# Patient Record
Sex: Male | Born: 1970 | Race: White | Hispanic: No | Marital: Married | Smoking: Never smoker
Health system: Southern US, Community
[De-identification: ages and names within clinical notes are randomized; demographics above are authoritative.]

---

## 2003-09-07 ENCOUNTER — Encounter: Admission: RE | Admit: 2003-09-07 | Discharge: 2003-10-27 | Payer: Self-pay | Admitting: Neurosurgery

## 2009-08-25 ENCOUNTER — Encounter: Admission: RE | Admit: 2009-08-25 | Discharge: 2009-08-25 | Payer: Self-pay | Admitting: Unknown Physician Specialty

## 2010-04-10 ENCOUNTER — Encounter: Payer: Self-pay | Admitting: Family Medicine

## 2016-12-20 ENCOUNTER — Ambulatory Visit (INDEPENDENT_AMBULATORY_CARE_PROVIDER_SITE_OTHER): Payer: BLUE CROSS/BLUE SHIELD | Admitting: Cardiovascular Disease

## 2016-12-20 ENCOUNTER — Encounter: Payer: Self-pay | Admitting: Cardiovascular Disease

## 2016-12-20 VITALS — BP 126/96 | HR 79 | Ht 73.0 in | Wt 245.4 lb

## 2016-12-20 DIAGNOSIS — R0989 Other specified symptoms and signs involving the circulatory and respiratory systems: Secondary | ICD-10-CM | POA: Diagnosis not present

## 2016-12-20 DIAGNOSIS — R002 Palpitations: Secondary | ICD-10-CM | POA: Diagnosis not present

## 2016-12-20 DIAGNOSIS — F329 Major depressive disorder, single episode, unspecified: Secondary | ICD-10-CM | POA: Diagnosis not present

## 2016-12-20 DIAGNOSIS — F419 Anxiety disorder, unspecified: Secondary | ICD-10-CM | POA: Diagnosis not present

## 2016-12-20 DIAGNOSIS — Z13220 Encounter for screening for lipoid disorders: Secondary | ICD-10-CM | POA: Diagnosis not present

## 2016-12-20 DIAGNOSIS — F32A Depression, unspecified: Secondary | ICD-10-CM

## 2016-12-20 MED ORDER — METOPROLOL TARTRATE 25 MG PO TABS: 25.0000 mg | ORAL_TABLET | ORAL | 3 refills | Status: AC | PRN

## 2016-12-20 NOTE — Progress Notes (Signed)
Cardiology Office Note    Date:  12/20/2016   ID:  Cole Barrera, DOB Jul 28, 1970, MRN [id number]  PCP:  Medicine, [facility]  Cardiologist:  Nicki Guadalajara, MD   New cardiology consultation, referred through the request of [name], PA-C , for evaluation of palpitations.  History of Present Illness:  Cole Barrera is a 46 y.o. male who was seen yesterday by [name], PA-C  at [facility] family [facility] in [place] with complaints of palpitations.  The patient is now referred for cardiology consultation and further evaluation.  Mr. Cole Barrera admits to a history of intermittent palpitations.  These often would occur fairly infrequently, but have been occurring off and on some time but typically will be very short-lived.  However, 2 days ago, he noticed more frequent palpitations which lasted for almost 48 hours.  He felt like "butterflies."  He denied any associated chest tightness.  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  He has been under increased stress.  2 days ago work was more stressful.  He started noticed palpitations or frequently at home.  In the persisted leading to his evaluation yesterday.  Of note, during his evaluation yesterday on ECG.  He was found to have normal rhythm.  However, the patient states that he again started to feel the palpitations and when he was reexamined.  He was told that most likely he was experiencing premature beats rather than atrial fibrillation.  Yesterday when he presented for evaluation.  His blood pressure was 130/82 and his resting pulse was 94.  The patient states that he has a history of depression and had been taking Wellbutrin up to 3 mg but stopped taking this proximally 6 months ago.  Approximately 2 weeks ago he restarted taking Wellbutrin at 150 mg.  He does not drink coffee.  He drinks a 20  ounce Pepsi at least once a day.  He walks a lot at work and denies any exercise mediated palpitations.  He denies presyncope or syncope.  He is unaware of any cardiac murmurs.  He's never had an echo Doppler study.  Many years ago he believes he saw a cardiologist at [facility] but was not told of any significant abnormalities.  He is now referred for cardiology consultation.   No past medical history on file.  Past medical history is notable for depression.  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   Past surgical history is notable for ganglion cyst removed.  Current Medications: No outpatient prescriptions prior to visit.   No facility-administered medications prior to visit.     For the past 2 weeks.  He restarted taking Wellbutrin 150 mg daily.  He has a prescription for Valium which he takes when necessary.  Allergies:   Penicillins   Social History   Social History  . Marital status: Married    Spouse name: N/A  . Number of children: N/A  . Years of education: N/A   Social History Main Topics  . Smoking status: Never Smoker  . Smokeless tobacco: Never Used  . Alcohol use None  . Drug use: Unknown  . Sexual activity: Not Asked   Other Topics Concern  . None   Social History Narrative  .  None    Socially he is in his second marriage.  There is no tobacco use.  He is not routinely exercise outside of walking significant amount at work.  He has 2 children from a previous marriage and 3 children from his current marriage of 15 years.  There is no tobacco use.  He works at [employer] as a Production designer, theatre/television/film in Journalist, newspaper.  Family History:  The patient's family history includes Alcohol abuse in his mother; Heart disease in his maternal grandfather and maternal grandmother; Skin cancer in his brother.  His mother died with apparent suicide at age 38.   Father is living at age 96.  He has 2 brothers, one age 74 and 67 years old.  ROS General: Negative; No fevers, chills, or night sweats;  HEENT: Negative; No changes in vision or hearing, sinus congestion, difficulty swallowing Pulmonary: Negative; No cough, wheezing, shortness of breath, hemoptysis Cardiovascular: Negative; No chest pain, presyncope, syncope, palpitations GI: Negative; No nausea, vomiting, diarrhea, or abdominal pain GU: Negative; No dysuria, hematuria, or difficulty voiding Musculoskeletal: Positive for lumbar back pain Hematologic/Oncology: Negative; no easy bruising, bleeding Endocrine: Negative; no heat/cold intolerance; no diabetes Neuro: Negative; no changes in balance, headaches Skin: Negative; No rashes or skin lesions Psychiatric: Positive for anxiety and depression Sleep: Negative; No snoring, daytime sleepiness, hypersomnolence, bruxism, restless legs, hypnogognic hallucinations, no cataplexy Other comprehensive 14 point system review is negative.   PHYSICAL EXAM:   VS:  BP (!) 126/96   Pulse 79   Ht  (1.854 m)   Wt 245 lb 6.4 oz (111.3 kg)   BMI 32.38 kg/m     Repeat blood pressure by me was 110/84  Wt Readings from Last 3 Encounters:  12/20/16 245 lb 6.4 oz (111.3 kg)    General: Alert, oriented, no distress.  Skin: normal turgor, no rashes, warm and dry HEENT: Normocephalic, atraumatic. Pupils equal round and reactive to light; sclera anicteric; extraocular muscles intact; Fundi Without hemorrhages or exudates. Nose without nasal septal hypertrophy Mouth/Parynx benign; Mallinpatti scale 2 Neck: No JVD, no carotid bruits; normal carotid upstroke Lungs: clear to ausculatation and percussion; no wheezing or rales Chest wall: without tenderness to palpitation Heart: PMI not displaced, RRR, s1 s2 normal, faint 1/6 systolic murmur, no diastolic murmur, no rubs, gallops, thrills, or heaves Abdomen: soft, nontender; no hepatosplenomehaly, BS+;  abdominal aorta nontender and not dilated by palpation. Back: no CVA tenderness Pulses 2+ Musculoskeletal: full range of motion, normal strength, no joint deformities Extremities: Congenital almost complete absence of his left hand fingers; no clubbing cyanosis or edema, Homan's sign negative  Neurologic: grossly nonfocal; Cranial nerves grossly wnl Psychologic: Normal mood and affect   Studies/Labs Reviewed:   EKG:  EKG is ordered today.  ECG (independently read by me): Normal sinus rhythm at 79 bpm.  No ectopy.  No ST segment changes.  Normal intervals.  I also personally reviewed the ECG, which was done yesterday, 12/19/2016,  at his primary physician's office.  This showed normal sinus rhythm at 86 bpm.  No ST segment pain changes.  No ectopy.  Normal intervals.  Recent Labs: No flowsheet data found.   No flowsheet data found.  No flowsheet data found. No results found for: MCV No results found for: TSH No results found for: HGBA1C   BNP No results found for: BNP  ProBNP No results found for: PROBNP   Lipid Panel  No results found for: CHOL, TRIG, HDL, CHOLHDL, VLDL, LDLCALC, LDLDIRECT   RADIOLOGY:  No results found.   Additional studies/ records that were reviewed today include:  I reviewed the [facility] records from [facility].   ASSESSMENT:    1. Palpitations   2. Anxiety and depression   3. Labile blood pressure   4. Lipid screening      PLAN:  Mr. 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  He has noticed an intermittent history of episodic palpitations which typically would be very short-lived and resolve spontaneously.  However, over the past several days he's noticed significantly more frequent palpitations which persisted for almost 2 days leading to his primary care evaluation yesterday, and ultimate cardiology  consultation today.  He denies any history of exercise induced symptomatology.  He denies any chest tightness or pressure.  He denies presyncope or syncope.  His ECGs from yesterday and today are normal.  I suspect he may also have some stress mediated hypertension since he often checks his blood pressure at work and tells me on averages runs around 140/90.  He was told by his provider yesterday that is likely was having premature beats rather than atrial fibrillation.  He has not had recent laboratory.  I will check fasting laboratory today consisting of a chemistry profile, CBC, magnesium level, TSH, and also will also check fasting lipids.  I will schedule him for 2-D echo Doppler study to evaluate for potential structural heart disease.  I'm also scheduling him for 48 hour Holter monitor study to quantify if possible.  The extent infrequency of his palpitations.  Presently, I'm giving him a prescription for metoprolol, tartrate to take 25 mg on an as-needed basis.  If he does experience significant palpitations.  If his blood pressure continues to be elevated and he continues have frequent palpitations.  He may need to be started on metoprolol succinate on a daily basis, but I will not do this presently.  I have recommended absence of caffeine.  I suspect his palpitations are stress mediated leading to a hyperadrenergic response which should improve with low-dose beta blocker therapy if necessary.  I will contact him regarding the results of his laboratory.  He was wondering if he should take today's dose of well percussion.  I have suggested that he can resume this therapy.  QTc interval is normal.  I will see him back in the office and 4- 6 weeks for reevaluation and further recommendations will be made at that time.   Medication Adjustments/Labs and Tests Ordered: Current medicines are reviewed at length with the patient today.  Concerns regarding medicines are outlined above.  Medication changes, Labs  and Tests ordered today are listed in the Patient Instructions below. Patient Instructions  Medication Instructions:  Start metoprolol tartrate 25 mg AS NEEDED for palpitations-rx sent to pharmacy  Labwork: TODAY (CMET, CBC, TSH, Lipid, Mag)  Testing/Procedures: Your physician has recommended that you wear a 48 holter monitor. Holter monitors are medical devices that record the heart's electrical activity. Doctors most often use these monitors to diagnose arrhythmias. Arrhythmias are problems with the speed or rhythm of the heartbeat. The monitor is a small, portable device. You can wear one while you do your normal daily activities. This is usually used to diagnose what is causing palpitations/syncope (passing out).  Your physician has requested that you have an echocardiogram. Echocardiography is a painless test that uses sound waves to create images of your  heart. It provides your doctor with information about the size and shape of your heart and how well your heart's chambers and valves are working. This procedure takes approximately one hour. There are no restrictions for this procedure.  This will be done at our [facility] location:  [address]  Follow-Up: Your physician recommends that you schedule a follow-up appointment in: 4-6 weeks with Dr. Tresa Endo     If you need a refill on your cardiac medications before your next appointment, please call your pharmacy.      Signed, Nicki Guadalajara, MD  12/20/2016 12:56 PM    [facility] [address], [address], [place], [place]  [address] Phone: [phone]

## 2016-12-20 NOTE — Patient Instructions (Signed)
Medication Instructions:  Start metoprolol tartrate 25 mg AS NEEDED for palpitations-rx sent to pharmacy  Labwork: TODAY (CMET, CBC, TSH, Lipid, Mag)  Testing/Procedures: Your physician has recommended that you wear a 48 holter monitor. Holter monitors are medical devices that record the heart's electrical activity. Doctors most often use these monitors to diagnose arrhythmias. Arrhythmias are problems with the speed or rhythm of the heartbeat. The monitor is a small, portable device. You can wear one while you do your normal daily activities. This is usually used to diagnose what is causing palpitations/syncope (passing out).  Your physician has requested that you have an echocardiogram. Echocardiography is a painless test that uses sound waves to create images of your heart. It provides your doctor with information about the size and shape of your heart and how well your heart's chambers and valves are working. This procedure takes approximately one hour. There are no restrictions for this procedure.  This will be done at our [facility] location:  [address]  Follow-Up: Your physician recommends that you schedule a follow-up appointment in: 4-6 weeks with Dr. Tresa Endo     If you need a refill on your cardiac medications before your next appointment, please call your pharmacy.

## 2016-12-20 NOTE — Addendum Note (Signed)
Addended by: Chana Bode on: 12/20/2016 03:45 PM   Modules accepted: Orders

## 2016-12-21 LAB — COMPREHENSIVE METABOLIC PANEL
ALT: 23 IU/L (ref 0–44)
AST: 16 IU/L (ref 0–40)
Albumin/Globulin Ratio: 1.7 (ref 1.2–2.2)
Albumin: 4.7 g/dL (ref 3.5–5.5)
Alkaline Phosphatase: 82 IU/L (ref 39–117)
BUN/Creatinine Ratio: 10 (ref 9–20)
BUN: 13 mg/dL (ref 6–24)
Bilirubin Total: 1.1 mg/dL (ref 0.0–1.2)
CO2: 22 mmol/L (ref 20–29)
Calcium: 9.7 mg/dL (ref 8.7–10.2)
Chloride: 103 mmol/L (ref 96–106)
Creatinine, Ser: 1.25 mg/dL (ref 0.76–1.27)
GFR calc Af Amer: 78 mL/min/{1.73_m2} (ref >59)
GFR calc non Af Amer: 68 mL/min/{1.73_m2} (ref >59)
Globulin, Total: 2.8 g/dL (ref 1.5–4.5)
Glucose: 91 mg/dL (ref 65–99)
Potassium: 4.8 mmol/L (ref 3.5–5.2)
Sodium: 141 mmol/L (ref 134–144)
Total Protein: 7.5 g/dL (ref 6.0–8.5)

## 2016-12-21 LAB — CBC
Hematocrit: 46.9 % (ref 37.5–51.0)
Hemoglobin: 16.5 g/dL (ref 13.0–17.7)
MCH: 32.5 pg (ref 26.6–33.0)
MCHC: 35.2 g/dL (ref 31.5–35.7)
MCV: 92 fL (ref 79–97)
PLATELETS: 321 10*3/uL (ref 150–379)
RBC: 5.08 x10E6/uL (ref 4.14–5.80)
RDW: 13.4 % (ref 12.3–15.4)
WBC: 8.6 10*3/uL (ref 3.4–10.8)

## 2016-12-21 LAB — LIPID PANEL
CHOLESTEROL TOTAL: 167 mg/dL (ref 100–199)
Chol/HDL Ratio: 4.8 ratio (ref 0.0–5.0)
HDL: 35 mg/dL — ABNORMAL LOW (ref >39)
LDL Calculated: 70 mg/dL (ref 0–99)
Triglycerides: 310 mg/dL — ABNORMAL HIGH (ref 0–149)
VLDL CHOLESTEROL CAL: 62 mg/dL — AB (ref 5–40)

## 2016-12-21 LAB — TSH: TSH: 1.66 u[IU]/mL (ref 0.450–4.500)

## 2016-12-21 LAB — MAGNESIUM: MAGNESIUM: 2.2 mg/dL (ref 1.6–2.3)

## 2016-12-26 ENCOUNTER — Other Ambulatory Visit: Payer: Self-pay

## 2016-12-26 ENCOUNTER — Ambulatory Visit (HOSPITAL_COMMUNITY): Payer: BLUE CROSS/BLUE SHIELD | Attending: Internal Medicine

## 2016-12-26 DIAGNOSIS — R002 Palpitations: Secondary | ICD-10-CM | POA: Insufficient documentation

## 2016-12-28 ENCOUNTER — Encounter: Payer: Self-pay | Admitting: Cardiovascular Disease

## 2017-01-01 ENCOUNTER — Encounter: Payer: Self-pay | Admitting: Cardiovascular Disease

## 2017-01-02 ENCOUNTER — Other Ambulatory Visit: Payer: Self-pay | Admitting: *Deleted

## 2017-01-02 MED ORDER — OMEGA-3-ACID ETHYL ESTERS 1 G PO CAPS: 2.0000 g | ORAL_CAPSULE | Freq: Two times a day (BID) | ORAL | 3 refills | Status: DC

## 2017-01-03 ENCOUNTER — Ambulatory Visit (INDEPENDENT_AMBULATORY_CARE_PROVIDER_SITE_OTHER): Payer: BLUE CROSS/BLUE SHIELD

## 2017-01-03 DIAGNOSIS — R002 Palpitations: Secondary | ICD-10-CM

## 2017-01-08 ENCOUNTER — Other Ambulatory Visit: Payer: Self-pay | Admitting: *Deleted

## 2017-01-12 ENCOUNTER — Telehealth: Payer: Self-pay | Admitting: *Deleted

## 2017-01-12 NOTE — Telephone Encounter (Signed)
PA submitted via Covermymeds for Lovaza (omega -3-acid-ethyl esters)

## 2017-01-15 ENCOUNTER — Encounter: Payer: Self-pay | Admitting: Cardiovascular Disease

## 2017-02-01 ENCOUNTER — Ambulatory Visit (INDEPENDENT_AMBULATORY_CARE_PROVIDER_SITE_OTHER): Payer: BLUE CROSS/BLUE SHIELD | Admitting: Cardiovascular Disease

## 2017-02-01 ENCOUNTER — Encounter: Payer: Self-pay | Admitting: Cardiovascular Disease

## 2017-02-01 VITALS — BP 130/88 | HR 93 | Ht 73.0 in | Wt 242.0 lb

## 2017-02-01 DIAGNOSIS — R002 Palpitations: Secondary | ICD-10-CM

## 2017-02-01 DIAGNOSIS — E782 Mixed hyperlipidemia: Secondary | ICD-10-CM | POA: Diagnosis not present

## 2017-02-01 DIAGNOSIS — Z79899 Other long term (current) drug therapy: Secondary | ICD-10-CM | POA: Diagnosis not present

## 2017-02-01 DIAGNOSIS — R0789 Other chest pain: Secondary | ICD-10-CM | POA: Diagnosis not present

## 2017-02-01 NOTE — Progress Notes (Signed)
Cardiology Office Note    Date:  02/01/2017   ID:  Cole Barrera, DOB Jan 01, 1971, MRN [id number]  PCP:  [facility]  Cardiologist:  Shelva Majestic, MD   Cardiology consultation F/U, referred through the request of Clyde Lundborg, PA-C , for evaluation of palpitations.  History of Present Illness:  Cole Barrera is a 46 y.o. male who was seen yesterday by Clyde Lundborg, PA-C  at [facility] in [place] with complaints of palpitations.  The patient was seen for initial cardiology consultation on 12/20/2016.  He presents for follow-up evaluation. Mr. Cole Barrera admits to a history of intermittent palpitations.  These often would occur fairly infrequently, but have been occurring off and on some time but typically will be very short-lived.  However, 2 days ago, he noticed more frequent palpitations which lasted for almost 48 hours.  He felt like "butterflies."  He denied any associated chest tightness.  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  He has been under increased stress.  2 days ago work was more stressful.  He started noticed palpitations or frequently at home.  In the persisted leading to his evaluation yesterday.  Of note, during his evaluation yesterday on ECG.  He was found to have normal rhythm.  However, the patient states that he again started to feel the palpitations and when he was reexamined.  He was told that most likely he was experiencing premature beats rather than atrial fibrillation.  Yesterday when he presented for evaluation.  His blood pressure was 130/82 and his resting pulse was 94.  The patient states that he has a history of depression and had been taking Wellbutrin up to 3 mg but stopped taking this proximally 6 months ago.  Approximately 2 weeks ago he restarted taking Wellbutrin at 150 mg.  He does not  drink coffee.  He drinks a 20 ounce Pepsi at least once a day.  He walks a lot at work and denies any exercise mediated palpitations.  He denies presyncope or syncope.  He is unaware of any cardiac murmurs.  He's never had an echo Doppler study.  Many years ago he believes he saw a cardiologist at [facility] but was not told of any significant abnormalities.    When I initially saw him, I recommended that he undergo a 48-hour Holter monitor study as well as 2-D echo Doppler study.  Echo Doppler study done on 12/26/2016 revealed normal systolic function with an ejection fraction of 55-60%.  There was mild grade 1 diastolic dysfunction.  His left atrium was normal in size.  He did not have any significant valvular pathology.  Bony pressures were normal.  He wore a 48-hour Holter monitor which revealed predominant sinus rhythm with his slowest rate at 44 bpm the early morning while sleeping, and fastest heart rate demonstrating sinus tachycardia when he was stuck in prolonged traffic jam and was getting agitated.  He was noted to have only rare to occasional PAC with one atrial couplet and had rare PVCs with one ventricular couplet.  He has taken metoprolol 25 mg on a when necessary basis and several days and did note improvement in his sensation.  Upon further questioning today, he has noticed vague episode of chest discomfort which typically occurs when his heart rate is beating fast.  He denies PND, orthopnea.  He presents for reevaluation.  History reviewed. No pertinent past medical history.  Past medical history is notable for depression.  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 No pertinent surgical history.   Past surgical history is notable for ganglion cyst removed.  Current Medications: Outpatient Medications Prior to Visit  Medication Sig Dispense Refill  . diazepam (VALIUM) 5  MG tablet Take 1 tablet by mouth as directed.    . metoprolol tartrate (LOPRESSOR) 25 MG tablet Take 1 tablet (25 mg total) by mouth as needed. For palpitations 30 tablet 3  . Omega-3 Fatty Acids (FISH OIL PO) Take by mouth.    . omega-3 acid ethyl esters (LOVAZA) 1 g capsule Take 2 capsules (2 g total) by mouth 2 (two) times daily. 180 capsule 3  . buPROPion (WELLBUTRIN XL) 300 MG 24 hr tablet Take 1 tablet by mouth daily.     No facility-administered medications prior to visit.     For the past 2 weeks.  He restarted taking Wellbutrin 150 mg daily.  He has a prescription for Valium which he takes when necessary.  Allergies:   Penicillins   Social History   Socioeconomic History  . Marital status: Married    Spouse name: None  . Number of children: None  . Years of education: None  . Highest education level: None  Social Needs  . Financial resource strain: None  . Food insecurity - worry: None  . Food insecurity - inability: None  . Transportation needs - medical: None  . Transportation needs - non-medical: None  Occupational History  . None  Tobacco Use  . Smoking status: Never Smoker  . Smokeless tobacco: Never Used  Substance and Sexual Activity  . Alcohol use: None  . Drug use: None  . Sexual activity: None  Other Topics Concern  . None  Social History Narrative  . None    Socially he is in his second marriage.  There is no tobacco use.  He is not routinely exercise outside of walking significant amount at work.  He has 2 children from a previous marriage and 3 children from his current marriage of 59 years.  There is no tobacco use.  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  Family History:  The patient's family history includes Alcohol abuse in his mother; Heart disease in his maternal grandfather and maternal grandmother; Skin cancer in his brother.  His mother died with apparent suicide at age 60.  Father is living at age 62.  He has 2 brothers, one age  49 and 58 years old.  ROS General: Negative; No fevers, chills, or night sweats;  HEENT: Negative; No changes in vision or hearing, sinus congestion, difficulty swallowing Pulmonary: Negative; No cough, wheezing, shortness of breath, hemoptysis Cardiovascular: Negative; No chest pain, presyncope, syncope, palpitations GI: Negative; No nausea, vomiting, diarrhea, or abdominal pain GU: Negative; No dysuria, hematuria, or difficulty voiding Musculoskeletal: Positive for lumbar back pain Hematologic/Oncology: Negative; no easy bruising, bleeding Endocrine: Negative; no heat/cold intolerance; no diabetes Neuro: Negative; no changes in balance, headaches Skin: Negative; No rashes or skin lesions Psychiatric: Positive for anxiety and depression Sleep: Negative; No snoring, daytime sleepiness, hypersomnolence, bruxism, restless legs, hypnogognic hallucinations, no cataplexy Other comprehensive 14 point system review is negative.   PHYSICAL EXAM:   VS:  BP 130/88   Pulse 93   Ht '6\' 1"'$  (  1.854 m)   Wt 242 lb (109.8 kg)   BMI 31.93 kg/m     Repeat blood pressure by me was 122/84.  Wt Readings from Last 3 Encounters:  02/01/17 242 lb (109.8 kg)  12/20/16 245 lb 6.4 oz (111.3 kg)      Physical Exam BP 130/88   Pulse 93   Ht '6\' 1"'$  (1.854 m)   Wt 242 lb (109.8 kg)   BMI 31.93 kg/m  General: Alert, oriented, no distress.  Skin: normal turgor, no rashes, warm and dry HEENT: Normocephalic, atraumatic. Pupils equal round and reactive to light; sclera anicteric; extraocular muscles intact;  Nose without nasal septal hypertrophy Mouth/Parynx benign; Mallinpatti scale 2 Neck: No JVD, no carotid bruits; normal carotid upstroke Lungs: clear to ausculatation and percussion; no wheezing or rales Chest wall: without tenderness to palpitation Heart: PMI not displaced, RRR, s1 s2 normal, 1/6 systolic murmur, no diastolic murmur, no rubs, gallops, thrills, or heaves Abdomen: soft, nontender; no  hepatosplenomehaly, BS+; abdominal aorta nontender and not dilated by palpation. Back: no CVA tenderness Pulses 2+ Musculoskeletal: full range of motion, normal strength, no joint deformities Extremities: Almost complete absence of his left hand fingers congenital in etiology;;no clubbing cyanosis or edema, Homan's sign negative  Neurologic: grossly nonfocal; Cranial nerves grossly wnl Psychologic: Normal mood and affect   Studies/Labs Reviewed:    ECG (independently read by me): Normal sinus rhythm at 93 bpm.  No ectopy.  Rhythm strip was done by the nurse since patient felt he was experiencing some sensation of increased heart rate with palpitations, revealed normal sinus rhythm.  No ectopy.  Heart rate in the 90s.  ECG (independently read by me): Normal sinus rhythm at 79 bpm.  No ectopy.  No ST segment changes.  Normal intervals.  I also personally reviewed the ECG, which was done yesterday, 12/19/2016,  at his primary physician's office.  This showed normal sinus rhythm at 86 bpm.  No ST segment pain changes.  No ectopy.  Normal intervals.  Recent Labs: BMP Latest Ref Rng & Units 12/20/2016  Glucose 65 - 99 mg/dL 91  BUN 6 - 24 mg/dL 13  Creatinine 0.76 - 1.27 mg/dL 1.25  BUN/Creat Ratio 9 - 20 10  Sodium 134 - 144 mmol/L 141  Potassium 3.5 - 5.2 mmol/L 4.8  Chloride 96 - 106 mmol/L 103  CO2 20 - 29 mmol/L 22  Calcium 8.7 - 10.2 mg/dL 9.7     Hepatic Function Latest Ref Rng & Units 12/20/2016  Total Protein 6.0 - 8.5 g/dL 7.5  Albumin 3.5 - 5.5 g/dL 4.7  AST 0 - 40 IU/L 16  ALT 0 - 44 IU/L 23  Alk Phosphatase 39 - 117 IU/L 82  Total Bilirubin 0.0 - 1.2 mg/dL 1.1    CBC Latest Ref Rng & Units 12/20/2016  WBC 3.4 - 10.8 x10E3/uL 8.6  Hemoglobin 13.0 - 17.7 g/dL 16.5  Hematocrit 37.5 - 51.0 % 46.9  Platelets 150 - 379 x10E3/uL 321   Lab Results  Component Value Date   MCV 92 12/20/2016   Lab Results  Component Value Date   TSH 1.660 12/20/2016   No results found  for: HGBA1C   BNP No results found for: BNP  ProBNP No results found for: PROBNP   Lipid Panel     Component Value Date/Time   CHOL 167 12/20/2016 1232   TRIG 310 (H) 12/20/2016 1232   HDL 35 (L) 12/20/2016 1232   CHOLHDL 4.8 12/20/2016 1232  [place] 70 12/20/2016 1232     RADIOLOGY: No results found.   Additional studies/ records that were reviewed today include:  I reviewed the [facility] records from [facility].   ASSESSMENT:    1. Palpitations   2. Atypical chest pain   3. Medication management   4. Mixed hyperlipidemia      PLAN:  Mr. 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  He had noticed an intermittent history of episodic palpitations which typically would be very short-lived and resolve spontaneously.  However, over the past several days he's noticed significantly more frequent palpitations which persisted for almost 2 days leading to his primary care evaluation yesterday, and ultimate initial cardiology consultation.  Since I saw him on 12/20/2016, his 2-D echo Doppler study has shown normal systolic function without structural heart abnormalities.  There was very mild grade 1 diastolic dysfunction.  He had normal valvular architecture.  I reviewed his 48 hour Holter monitor study with him in detail.  This predominantly showed sinus rhythm with mild sinus arrhythmia.  His lowest heart rate was 44 bpm in the early morning while sleeping.  His fastest heartbeat was 144 bpm which was sinus tachycardia when he was getting very agitated and was stuck in a traffic jam.  There were rare isolated PVCs and one ventricular couplet, and occasional isolated PACs with one atrial couplet.  On days where he has felt this increased palpitation sensation.  He has tried the metoprolol 25 mg, which I had given him to take on a when necessary basis and  this has improved his symptomatology.  He also today admitted to experiencing any chest discomfort when his heart rate is beating fast.  He is uncertain if he has exertional precipitation to this discomfort.  For further evaluation.  I have recommended he undergo a routine graded exercise treadmill test, which will also be helpful to make certain he does not have any exercise-induced arrhythmia.  I reassured him that he did not have any significant structural heart disease.  I do not believe he requires daily, beta blocker therapy, but I recommended he continue with the when necessary metoprolol tartrate.  I suspect his palpitations are a hyperadrenergic response contributed by stress.  I again discussed importance of reduction of caffeine.  I reviewed recent laboratory.  Triglycerides were elevated and he is now on omega-3 fatty acids.  Apparently his insurance company denied the use of lovaza.  The his recent triglycerides were 310 with a VLDL of 62 and HDL low at 35, suggesting an atherogenic dyslipidemic panel and I suspect his LDL particle size is small.  In 4-5 months after significantly improved diet and increased exercise I have recommended a follow-up lipid panel.  I will see him in the office in follow-up evaluation.    Medication Adjustments/Labs and Tests Ordered: Current medicines are reviewed at length with the patient today.  Concerns regarding medicines are outlined above.  Medication changes, Labs and Tests ordered today are listed in the Patient Instructions below. Patient Instructions  Medication Instructions:  Your physician recommends that you continue on your current medications as directed. Please refer to the Current Medication list given to you today.  Labwork: Please return for FASTING labs in 4-5 months (Lipid)  Our in office lab hours are Monday-Friday 8:00-4:30, closed for lunch 1-2 pm.  No appointment needed.  Testing/Procedures: Your physician  has requested that you have  an exercise tolerance test. For further information please visit [email or web address]. Please also follow instruction sheet, as given.  Follow-Up: Your physician wants you to follow-up in: 6 months with Dr. Claiborne Billings.  You will receive a reminder letter in the mail two months in advance. If you don't receive a letter, please call our office to schedule the follow-up appointment.   Any Other Special Instructions Will Be Listed Below (If Applicable).     If you need a refill on your cardiac medications before your next appointment, please call your pharmacy.      Signed, Shelva Majestic, MD  02/01/2017 4:31 PM    [facility] [address] [place], [place], [place]  [address] Phone: [phone]

## 2017-02-01 NOTE — Patient Instructions (Signed)
Medication Instructions:  Your physician recommends that you continue on your current medications as directed. Please refer to the Current Medication list given to you today.  Labwork: Please return for FASTING labs in 4-5 months (Lipid)  Our in office lab hours are Monday-Friday 8:00-4:30, closed for lunch 1-2 pm.  No appointment needed.  Testing/Procedures: Your physician has requested that you have an exercise tolerance test. For further information please visit [email or web address]. Please also follow instruction sheet, as given.  Follow-Up: Your physician wants you to follow-up in: 6 months with Dr. Tresa EndoKelly.  You will receive a reminder letter in the mail two months in advance. If you don't receive a letter, please call our office to schedule the follow-up appointment.   Any Other Special Instructions Will Be Listed Below (If Applicable).     If you need a refill on your cardiac medications before your next appointment, please call your pharmacy.

## 2017-02-07 ENCOUNTER — Telehealth (HOSPITAL_COMMUNITY): Payer: Self-pay

## 2017-02-07 NOTE — Telephone Encounter (Signed)
Encounter complete. 

## 2017-02-14 ENCOUNTER — Inpatient Hospital Stay (HOSPITAL_COMMUNITY)
Admission: RE | Admit: 2017-02-14 | Payer: BLUE CROSS/BLUE SHIELD | Source: Ambulatory Visit | Attending: Cardiovascular Disease | Admitting: Cardiovascular Disease

## 2017-04-11 ENCOUNTER — Telehealth (HOSPITAL_COMMUNITY): Payer: Self-pay | Admitting: Cardiovascular Disease

## 2017-04-11 NOTE — Telephone Encounter (Signed)
03/09/17 Called pt and lmsg for him to CB to r/s ETT..RG 03/27/17 Called pt and lmsg for him to Cb to r/s ETT..RG 03/29/17 Called pt and lmsg for him to CB to r/s ETT..RG 04/11/17 Called pt and lmsg for him to CB to get r/s for ETT.Marland Kitchen.RG

## 2018-08-26 ENCOUNTER — Other Ambulatory Visit: Payer: Self-pay | Admitting: Neurology

## 2018-08-26 DIAGNOSIS — S060X1A Concussion with loss of consciousness of 30 minutes or less, initial encounter: Secondary | ICD-10-CM

## 2018-08-28 ENCOUNTER — Inpatient Hospital Stay: Admission: RE | Admit: 2018-08-28 | Payer: BLUE CROSS/BLUE SHIELD | Source: Ambulatory Visit

## 2018-08-28 ENCOUNTER — Other Ambulatory Visit: Payer: BC Managed Care – PPO

## 2018-08-28 ENCOUNTER — Ambulatory Visit
Admission: RE | Admit: 2018-08-28 | Discharge: 2018-08-28 | Disposition: A | Discharge status: Home / Self Care | Payer: BC Managed Care – PPO | Source: Ambulatory Visit | Attending: Neurology | Admitting: Neurology

## 2018-08-28 DIAGNOSIS — S060X1A Concussion with loss of consciousness of 30 minutes or less, initial encounter: Secondary | ICD-10-CM

## 2018-09-05 ENCOUNTER — Other Ambulatory Visit: Payer: Self-pay | Admitting: Neurology

## 2019-06-18 ENCOUNTER — Other Ambulatory Visit: Payer: Self-pay | Admitting: Sports Medicine

## 2019-06-18 DIAGNOSIS — M542 Cervicalgia: Secondary | ICD-10-CM

## 2019-06-18 DIAGNOSIS — M5412 Radiculopathy, cervical region: Secondary | ICD-10-CM

## 2019-06-23 ENCOUNTER — Other Ambulatory Visit: Payer: Self-pay

## 2019-06-23 ENCOUNTER — Ambulatory Visit
Admission: RE | Admit: 2019-06-23 | Discharge: 2019-06-23 | Disposition: A | Discharge status: Home / Self Care | Payer: BC Managed Care – PPO | Source: Ambulatory Visit | Attending: Sports Medicine | Admitting: Sports Medicine

## 2019-06-23 DIAGNOSIS — M5412 Radiculopathy, cervical region: Secondary | ICD-10-CM

## 2019-06-23 DIAGNOSIS — M542 Cervicalgia: Secondary | ICD-10-CM

## 2021-03-27 IMAGING — MR MR CERVICAL SPINE W/O CM
5 series · 37 of 48 positions shown · non-contrast
Comparison: Head CT 08/28/2018.

CLINICAL DATA: 48-year-old female with 6 months of pain and
numbness radiating to the right arm. No known injury.

EXAM:
MRI CERVICAL SPINE WITHOUT CONTRAST
TECHNIQUE: Multiplanar, multisequence MR imaging of the cervical spine was
performed. No intravenous contrast was administered.

[Series 5: T1 · sagittal · 3.0mm · 0.82mm/px · 6 of 15 slices shown]
[im 1/15]
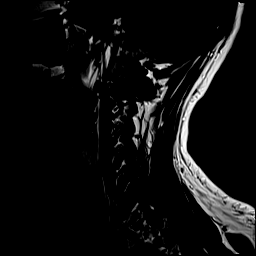
[im 3/15]
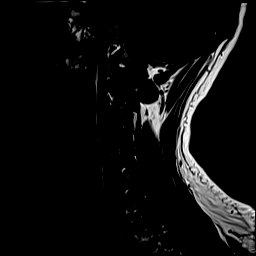
[im 6/15]
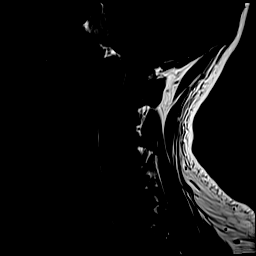
[im 9/15]
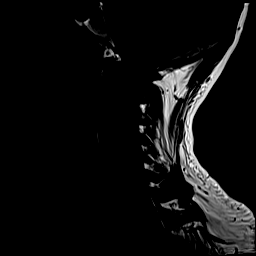
[im 12/15]
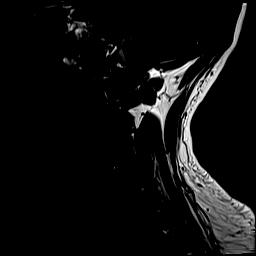
[im 15/15]
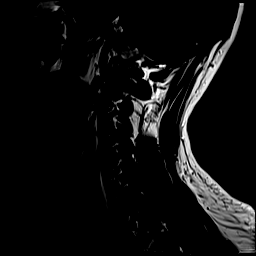

[Series 6: T2 · sagittal · 3.0mm · 0.55mm/px · 6 of 15 slices shown (1 of 2)]
[im 1/15]
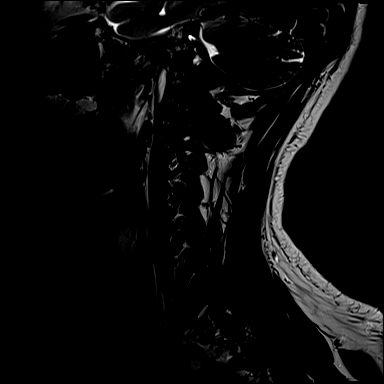
[im 3/15]
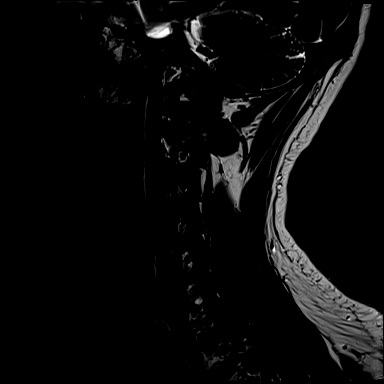
[im 6/15]
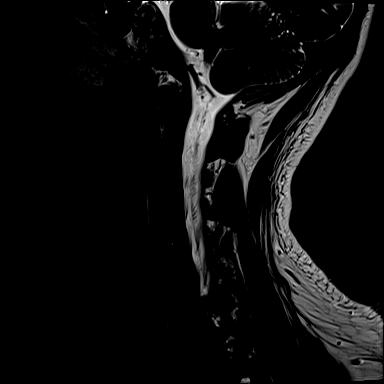
[im 9/15]
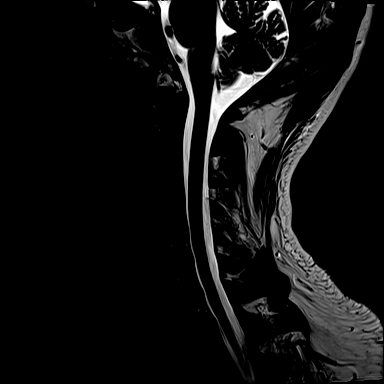
[im 12/15]
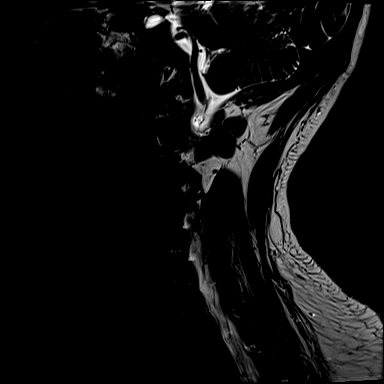
[im 15/15]
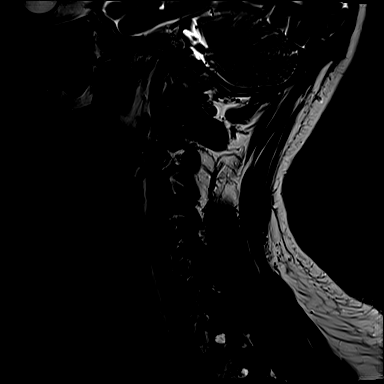

[Series 7: STIR · sagittal · 3.0mm · 0.33mm/px · 6 of 15 slices shown]
[im 1/15]
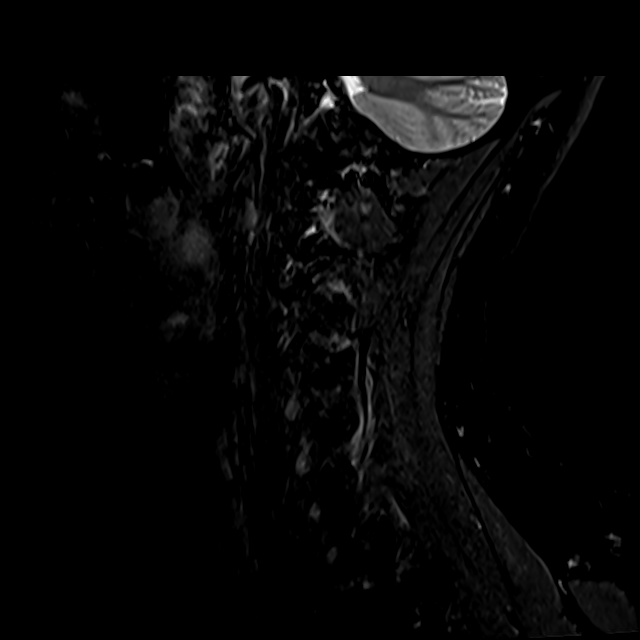
[im 3/15]
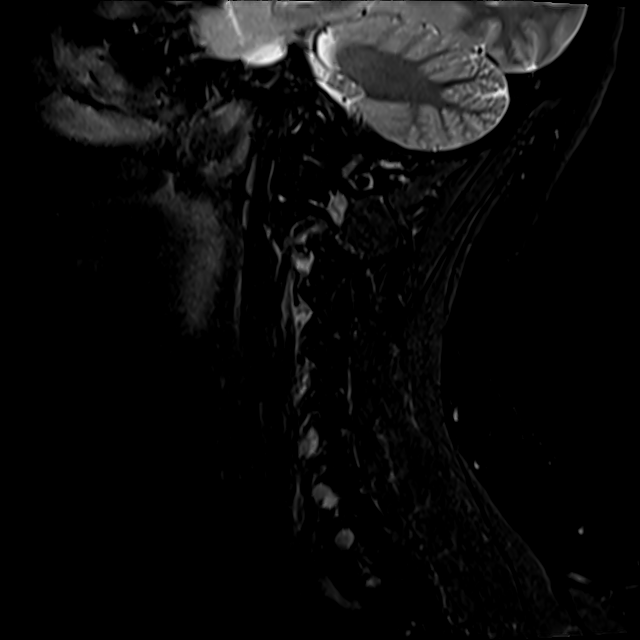
[im 6/15]
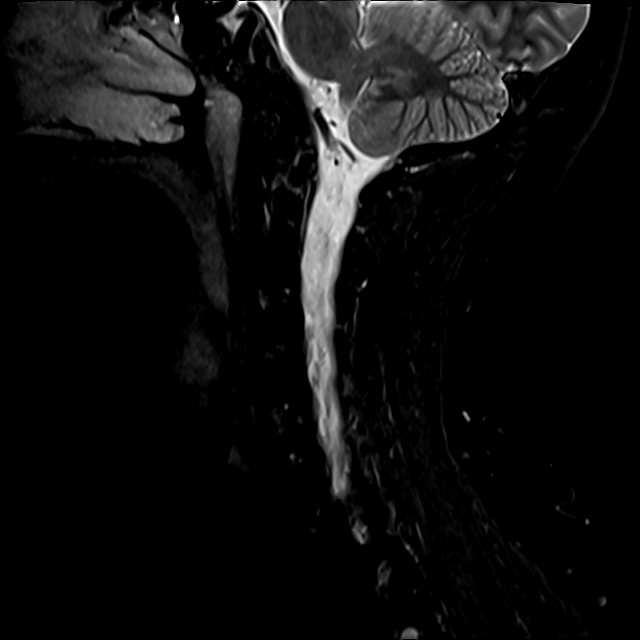
[im 9/15]
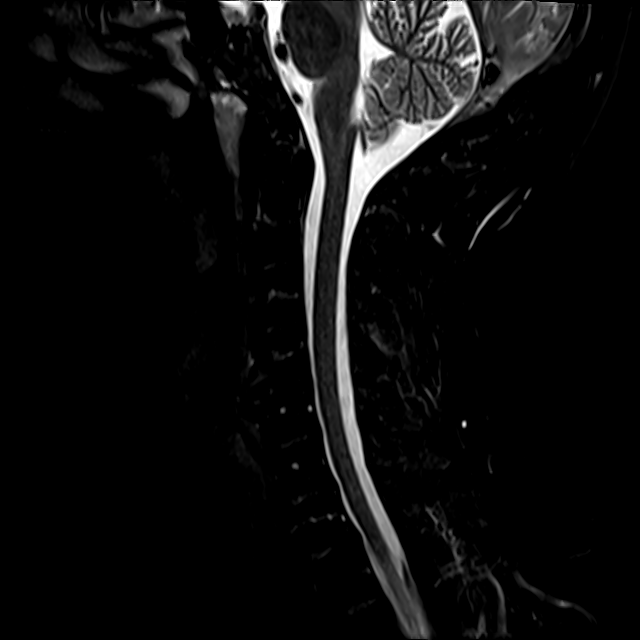
[im 12/15]
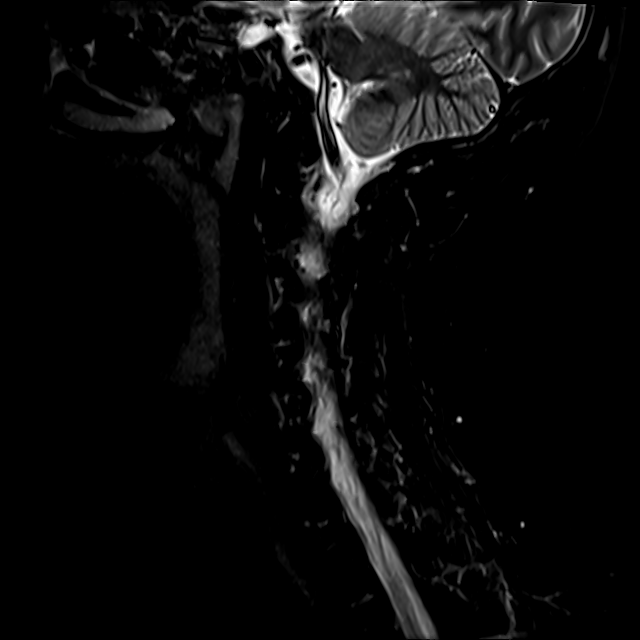
[im 15/15]
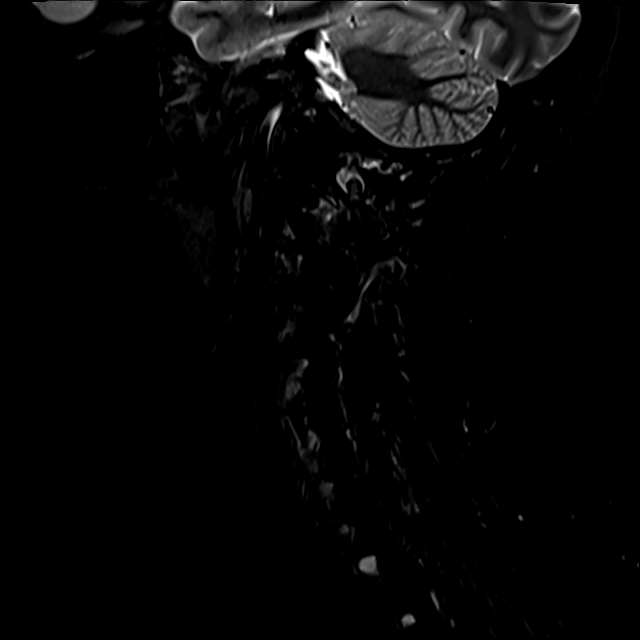

[Series 8: T2 · axial · 3.0mm · 0.62mm/px · z∈[-54,+57]mm · 11 of 35 slices shown (2 of 2)]
[im 1/35]
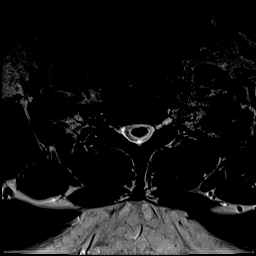
[im 3/35]
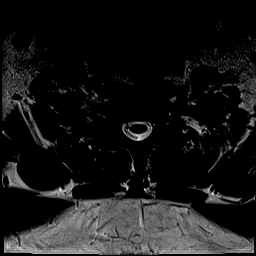
[im 5/35]
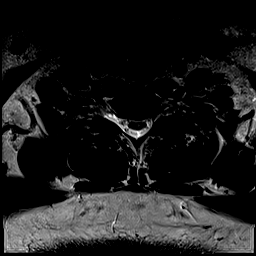
[im 8/35]
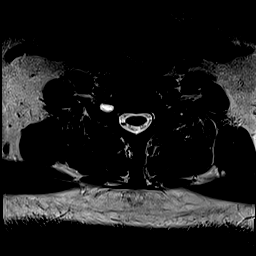
[im 10/35]
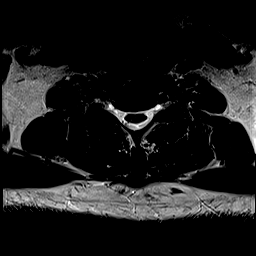
[im 15/35]
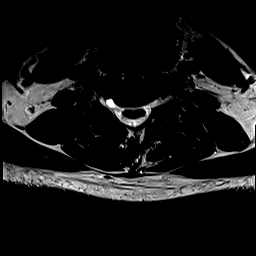
[im 18/35]
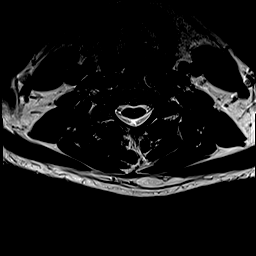
[im 20/35]
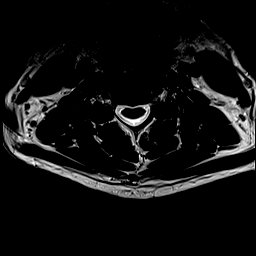
[im 25/35]
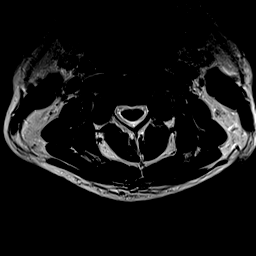
[im 30/35]
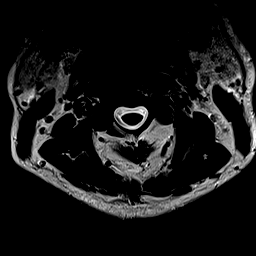
[im 35/35]
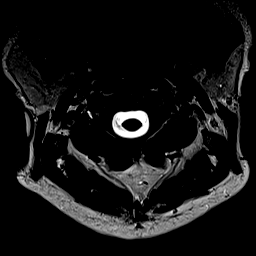

[Series 9: GRE · axial · 3.0mm · 0.83mm/px · z∈[-54,+57]mm · 8 of 35 slices shown]
[im 1/35]
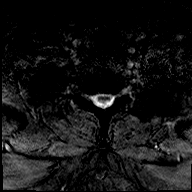
[im 5/35]
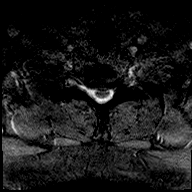
[im 10/35]
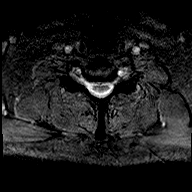
[im 15/35]
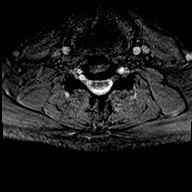
[im 20/35]
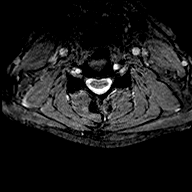
[im 25/35]
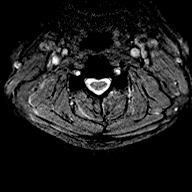
[im 30/35]
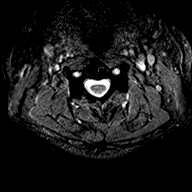
[im 35/35]
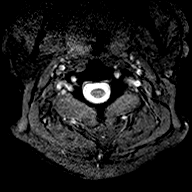

[37 of 48 positions shown; findings below may reference images not displayed]

FINDINGS: Alignment: Mild straightening of cervical lordosis appears similar
to the CT scout view last year. No spondylolisthesis.

Vertebrae: No marrow edema or evidence of acute osseous abnormality.
Visualized bone marrow signal is within normal limits.

Cord: Spinal cord signal is within normal limits at all visualized
levels. Capacious cervical spinal canal.

Posterior Fossa, vertebral arteries, paraspinal tissues:
Cervicomedullary junction is within normal limits. Negative visible
brain parenchyma.

Preserved major vascular flow voids in the neck. The left vertebral
artery may be mildly dominant. Negative visible neck soft tissues,
right lung apex.

Disc levels:

C2-C3:  Negative.

C3-C4: Mild endplate spurring at the foramina, mild left facet
hypertrophy. Mild left C4 foraminal stenosis.

C4-C5: Mostly anterior disc osteophyte complex. Mild facet
hypertrophy. No stenosis.

C5-C6: Minimal disc bulge and endplate spurring. No stenosis. Small
right side nerve root diverticulum (normal variant).

C6-C7: Minimal disc bulge and endplate spurring. Mild facet
hypertrophy. Borderline to mild bilateral C7 neural foraminal
stenosis and small right greater than left nerve root diverticula
(normal variant).

C7-T1: Mild broad-based posterior disc bulging in the midline
effaces the ventral CSF space but there is no significant spinal
stenosis (series 6, image 10). Mild facet hypertrophy. Mild left C8
foraminal stenosis.

Negative visible upper thoracic levels with small nerve root
diverticula.
IMPRESSION: 1. No acute osseous abnormality and generally mild for age cervical
spine degeneration.
2. No cervical spinal stenosis. There is up to mild neural foraminal
stenosis at the left C4, bilateral C7 and left C8 nerve levels.

## 2022-08-23 ENCOUNTER — Encounter: Payer: Self-pay | Admitting: Gastroenterology

## 2022-09-18 ENCOUNTER — Ambulatory Visit: Payer: BC Managed Care – PPO | Admitting: Gastroenterology

## 2022-09-18 ENCOUNTER — Encounter: Payer: Self-pay | Admitting: Gastroenterology
